# Patient Record
Sex: Male | Born: 1975 | Race: Black or African American | Hispanic: No | Smoking: Current every day smoker
Health system: Southern US, Community
[De-identification: ages and names within clinical notes are randomized; demographics above are authoritative.]

## PROBLEM LIST (undated history)

## (undated) DIAGNOSIS — K56609 Unspecified intestinal obstruction, unspecified as to partial versus complete obstruction: Secondary | ICD-10-CM

## (undated) HISTORY — PX: HERNIA REPAIR: SHX51

---
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
## 2005-07-14 HISTORY — PX: UMBILICAL HERNIA REPAIR: SHX196

## 2008-07-14 HISTORY — PX: NEUROPLASTY / TRANSPOSITION MEDIAN NERVE AT CARPAL TUNNEL: SUR893

## 2014-10-20 ENCOUNTER — Emergency Department (HOSPITAL_BASED_OUTPATIENT_CLINIC_OR_DEPARTMENT_OTHER): Payer: Federal, State, Local not specified - PPO

## 2014-10-20 ENCOUNTER — Encounter (HOSPITAL_BASED_OUTPATIENT_CLINIC_OR_DEPARTMENT_OTHER): Payer: Self-pay | Admitting: *Deleted

## 2014-10-20 ENCOUNTER — Emergency Department (HOSPITAL_BASED_OUTPATIENT_CLINIC_OR_DEPARTMENT_OTHER)
Admission: EM | Admit: 2014-10-20 | Discharge: 2014-10-20 | Disposition: A | Discharge status: Home / Self Care | Payer: Federal, State, Local not specified - PPO | Attending: Emergency Medicine | Admitting: Emergency Medicine

## 2014-10-20 DIAGNOSIS — R110 Nausea: Secondary | ICD-10-CM | POA: Insufficient documentation

## 2014-10-20 DIAGNOSIS — R1084 Generalized abdominal pain: Secondary | ICD-10-CM | POA: Diagnosis not present

## 2014-10-20 DIAGNOSIS — R1031 Right lower quadrant pain: Secondary | ICD-10-CM | POA: Insufficient documentation

## 2014-10-20 DIAGNOSIS — R140 Abdominal distension (gaseous): Secondary | ICD-10-CM | POA: Insufficient documentation

## 2014-10-20 LAB — COMPREHENSIVE METABOLIC PANEL
ALT: 39 U/L (ref 0–53)
AST: 30 U/L (ref 0–37)
Albumin: 4.7 g/dL (ref 3.5–5.2)
Alkaline Phosphatase: 85 U/L (ref 39–117)
Anion gap: 5 (ref 5–15)
BUN: 16 mg/dL (ref 6–23)
CALCIUM: 9.6 mg/dL (ref 8.4–10.5)
CO2: 29 mmol/L (ref 19–32)
CREATININE: 1.16 mg/dL (ref 0.50–1.35)
Chloride: 104 mmol/L (ref 96–112)
GFR calc Af Amer: >90 mL/min (ref >90)
GFR calc non Af Amer: 80 mL/min — ABNORMAL LOW (ref >90)
Glucose, Bld: 108 mg/dL — ABNORMAL HIGH (ref 70–99)
Potassium: 4 mmol/L (ref 3.5–5.1)
SODIUM: 138 mmol/L (ref 135–145)
TOTAL PROTEIN: 8.2 g/dL (ref 6.0–8.3)
Total Bilirubin: 0.5 mg/dL (ref 0.3–1.2)

## 2014-10-20 LAB — CBC
HCT: 45.7 % (ref 39.0–52.0)
Hemoglobin: 15.2 g/dL (ref 13.0–17.0)
MCH: 28.5 pg (ref 26.0–34.0)
MCHC: 33.3 g/dL (ref 30.0–36.0)
MCV: 85.7 fL (ref 78.0–100.0)
PLATELETS: 271 10*3/uL (ref 150–400)
RBC: 5.33 MIL/uL (ref 4.22–5.81)
RDW: 14.6 % (ref 11.5–15.5)
WBC: 14.6 10*3/uL — ABNORMAL HIGH (ref 4.0–10.5)

## 2014-10-20 LAB — URINALYSIS, ROUTINE W REFLEX MICROSCOPIC
BILIRUBIN URINE: NEGATIVE
GLUCOSE, UA: NEGATIVE mg/dL
Hgb urine dipstick: NEGATIVE
Ketones, ur: NEGATIVE mg/dL
Leukocytes, UA: NEGATIVE
NITRITE: NEGATIVE
PH: 7 (ref 5.0–8.0)
Protein, ur: NEGATIVE mg/dL
Specific Gravity, Urine: 1.019 (ref 1.005–1.030)
Urobilinogen, UA: 1 mg/dL (ref 0.0–1.0)

## 2014-10-20 LAB — LIPASE, BLOOD: Lipase: 22 U/L (ref 11–59)

## 2014-10-20 MED ORDER — IOHEXOL 300 MG/ML  SOLN
50.0000 mL | Freq: Once | INTRAMUSCULAR | Status: AC | PRN
  Administered 2014-10-20: 50 mL via ORAL

## 2014-10-20 MED ORDER — IOHEXOL 300 MG/ML  SOLN
100.0000 mL | Freq: Once | INTRAMUSCULAR | Status: AC | PRN
  Administered 2014-10-20: 100 mL via INTRAVENOUS

## 2014-10-20 MED ORDER — ONDANSETRON 4 MG PO TBDP: ORAL_TABLET | ORAL | Status: AC

## 2014-10-20 MED ORDER — HYDROMORPHONE HCL 1 MG/ML IJ SOLN
1.0000 mg | Freq: Once | INTRAMUSCULAR | Status: AC
  Administered 2014-10-20: 1 mg via INTRAVENOUS
  Filled 2014-10-20: qty 1

## 2014-10-20 MED ORDER — HYDROCODONE-ACETAMINOPHEN 5-325 MG PO TABS: 1.0000 | ORAL_TABLET | ORAL | Status: AC | PRN

## 2014-10-20 MED ORDER — ONDANSETRON HCL 4 MG/2ML IJ SOLN
4.0000 mg | Freq: Once | INTRAMUSCULAR | Status: AC
  Administered 2014-10-20: 4 mg via INTRAVENOUS
  Filled 2014-10-20: qty 2

## 2014-10-20 NOTE — ED Notes (Signed)
Patient transported to CT 

## 2014-10-20 NOTE — ED Provider Notes (Signed)
CSN: [id number]     Arrival date & time 10/20/14  1235 History   First MD Initiated Contact with Patient 10/20/14 1305     Chief Complaint  Patient presents with  . Abdominal Pain     (Consider location/radiation/quality/duration/timing/severity/associated sxs/prior Treatment) HPI Comments: 39 year old male complaining of sudden onset right lower quadrant abdominal pain beginning when he woke up earlier today. Pain has been constant, gradually increasing, worse with any movement, pressure, coughing, laughing. Pain is radiating throughout his entire abdomen. Admits to nausea without vomiting. Last bowel movement was yesterday and normal. No diarrhea. Denies fevers. Denies testicular pain or swelling. Denies any urinary symptoms. History of hernia repair. Yesterday evening for dinner he had chicken, and nothing out of his normal.  Patient is a 39 y.o. male presenting with abdominal pain. The history is provided by the patient.  Abdominal Pain Associated symptoms: nausea     History reviewed. No pertinent past medical history. Past Surgical History  Procedure Laterality Date  . Hernia repair     No family history on file. History  Substance Use Topics  . Smoking status: Never Smoker   . Smokeless tobacco: Not on file  . Alcohol Use: Yes    Review of Systems  Gastrointestinal: Positive for nausea and abdominal pain.  All other systems reviewed and are negative.     Allergies  Review of patient's allergies indicates no known allergies.  Home Medications   Prior to Admission medications   Medication Sig Start Date End Date Taking? Authorizing Provider  HYDROcodone-acetaminophen (NORCO/VICODIN) 5-325 MG per tablet Take 1-2 tablets by mouth every 4 (four) hours as needed. 10/20/14   Kathrynn Speed, PA-C  Multiple Vitamin (MULTIVITAMIN) tablet Take 1 tablet by mouth daily.   Yes Historical Provider, MD  ondansetron (ZOFRAN ODT) 4 MG disintegrating tablet  ODT q4 hours prn  nausea/vomit 10/20/14   Chaelyn Bunyan M Rylee Nuzum, PA-C   BP 145/76 mmHg  Pulse 91  Temp(Src) 98.5 F (36.9 C) (Oral)  Resp 18  Ht  (1.803 m)  Wt 255 lb (115.667 kg)  BMI 35.58 kg/m2  SpO2 97% Physical Exam  Constitutional: He is oriented to person, place, and time. He appears well-developed and well-nourished. No distress.  Uncomfortable but in NAD.  HENT:  Head: Normocephalic and atraumatic.  Eyes: Conjunctivae and EOM are normal.  Neck: Normal range of motion. Neck supple.  Cardiovascular: Normal rate, regular rhythm and normal heart sounds.   Pulmonary/Chest: Effort normal and breath sounds normal.  Abdominal: Bowel sounds are normal. He exhibits distension (mild). There is tenderness. There is rebound, guarding and tenderness at McBurney's point.  Generalized tenderness, worse right lower quadrant with guarding and rebound.  Musculoskeletal: Normal range of motion. He exhibits no edema.  Neurological: He is alert and oriented to person, place, and time.  Skin: Skin is warm and dry.  Psychiatric: He has a normal mood and affect. His behavior is normal.  Nursing note and vitals reviewed.   ED Course  Procedures (including critical care time) Labs Review Labs Reviewed  CBC - Abnormal; Notable for the following:    WBC 14.6 (*)    All other components within normal limits  COMPREHENSIVE METABOLIC PANEL - Abnormal; Notable for the following:    Glucose, Bld 108 (*)    GFR calc non Af Amer 80 (*)    All other components within normal limits  URINALYSIS, ROUTINE W REFLEX MICROSCOPIC  LIPASE, BLOOD    Imaging Review Ct Abdomen Pelvis  W Contrast  10/20/2014   CLINICAL DATA:  39 year old male with acute right abdominal and pelvic pain and nausea with chills.  EXAM: CT ABDOMEN AND PELVIS WITH CONTRAST  TECHNIQUE: Multidetector CT imaging of the abdomen and pelvis was performed using the standard protocol following bolus administration of intravenous contrast.  CONTRAST:  100mL OMNIPAQUE  IOHEXOL 300 MG/ML  SOLN  COMPARISON:  None.  FINDINGS: Lower chest:  Unremarkable  Hepatobiliary: The liver and gallbladder are unremarkable. There is no evidence of biliary dilatation.  Pancreas: Unremarkable  Spleen: Unremarkable  Adrenals/Urinary Tract: The kidneys, adrenal glands and bladder are unremarkable.  Stomach/Bowel: Unremarkable. There is no evidence of bowel obstruction or bowel wall thickening. The appendix is normal.  Vascular/Lymphatic: No enlarged lymph nodes or abdominal aortic aneurysm.  Reproductive: Prostate is unremarkable.  Other: No free fluid, abscess or pneumoperitoneum.  Musculoskeletal: No acute or suspicious abnormalities.  IMPRESSION: No acute or significant abnormalities.   Electronically Signed   By: Harmon PierJeffrey  Hu M.D.   On: 10/20/2014 15:04     EKG Interpretation None      MDM   Final diagnoses:  RLQ abdominal pain   Nontoxic appearing, uncomfortable but in no apparent distress. Vital signs stable. Afebrile. Abdomen is mildly distended, with significant tenderness in right lower quadrant. To obtain labs and obtain CT to evaluate for possible appendicitis.  3:15 PM CT negative. Labs showing leukocytosis of 14.6, otherwise no acute findings. UA negative. On re-examination, pt reports he is still in pain despite dilaudid and zofran. He is resting comfortably on bed moving around without difficulty. Abdomen is soft, still with generalized tenderness, however improved from initial exam. It is possible that he has a pulled muscle of his abdominal wall. Will d/c home with rx for vicodin and zofran. No vomiting in ED, tolerating PO. He has an appointment scheduled to establish care with primary care physician. Stable for discharge.   Kathrynn SpeedRobyn M Makell Drohan, PA-C 10/20/14 1517  Shon Batonourtney F Horton, MD 10/23/14 [phone]

## 2014-10-20 NOTE — Discharge Instructions (Signed)
Take Vicodin for severe pain only. No driving or operating heavy machinery while taking vicodin. This medication may cause drowsiness. Take Zofran as directed as needed for nausea. Follow up with your primary care physician as scheduled.  Abdominal Pain Many things can cause abdominal pain. Usually, abdominal pain is not caused by a disease and will improve without treatment. It can often be observed and treated at home. Your health care provider will do a physical exam and possibly order blood tests and X-rays to help determine the seriousness of your pain. However, in many cases, more time must pass before a clear cause of the pain can be found. Before that point, your health care provider may not know if you need more testing or further treatment. HOME CARE INSTRUCTIONS  Monitor your abdominal pain for any changes. The following actions may help to alleviate any discomfort you are experiencing:  Only take over-the-counter or prescription medicines as directed by your health care provider.  Do not take laxatives unless directed to do so by your health care provider.  Try a clear liquid diet (broth, tea, or water) as directed by your health care provider. Slowly move to a bland diet as tolerated. SEEK MEDICAL CARE IF:  You have unexplained abdominal pain.  You have abdominal pain associated with nausea or diarrhea.  You have pain when you urinate or have a bowel movement.  You experience abdominal pain that wakes you in the night.  You have abdominal pain that is worsened or improved by eating food.  You have abdominal pain that is worsened with eating fatty foods.  You have a fever. SEEK IMMEDIATE MEDICAL CARE IF:   Your pain does not go away within 2 hours.  You keep throwing up (vomiting).  Your pain is felt only in portions of the abdomen, such as the right side or the left lower portion of the abdomen.  You pass bloody or black tarry stools. MAKE SURE YOU:  Understand  these instructions.   Will watch your condition.   Will get help right away if you are not doing well or get worse.  Document Released: 04/09/2005 Document Revised: 07/05/2013 Document Reviewed: 03/09/2013 [facility] Patient Information 2015 [place], [place]. This information is not intended to replace advice given to you by your health care provider. Make sure you discuss any questions you have with your health care provider.

## 2014-10-20 NOTE — ED Notes (Signed)
Pt c/o RLQ pain that began this morning when he woke up. Pt c/o nausea but no vomiting or diarrhea.

## 2014-10-25 ENCOUNTER — Observation Stay (HOSPITAL_COMMUNITY)
Admission: EM | Admit: 2014-10-25 | Discharge: 2014-10-26 | Disposition: A | Discharge status: Home / Self Care | Payer: Federal, State, Local not specified - PPO | Attending: Internal Medicine | Admitting: Internal Medicine

## 2014-10-25 ENCOUNTER — Encounter (HOSPITAL_COMMUNITY): Payer: Self-pay | Admitting: Family Medicine

## 2014-10-25 ENCOUNTER — Observation Stay (HOSPITAL_COMMUNITY): Payer: Federal, State, Local not specified - PPO

## 2014-10-25 DIAGNOSIS — K5660 Unspecified intestinal obstruction: Secondary | ICD-10-CM | POA: Diagnosis not present

## 2014-10-25 DIAGNOSIS — R109 Unspecified abdominal pain: Secondary | ICD-10-CM | POA: Diagnosis present

## 2014-10-25 DIAGNOSIS — R1030 Lower abdominal pain, unspecified: Secondary | ICD-10-CM | POA: Diagnosis present

## 2014-10-25 DIAGNOSIS — L03311 Cellulitis of abdominal wall: Principal | ICD-10-CM | POA: Insufficient documentation

## 2014-10-25 DIAGNOSIS — I889 Nonspecific lymphadenitis, unspecified: Secondary | ICD-10-CM | POA: Diagnosis not present

## 2014-10-25 DIAGNOSIS — K5669 Other intestinal obstruction: Secondary | ICD-10-CM

## 2014-10-25 HISTORY — DX: Unspecified intestinal obstruction, unspecified as to partial versus complete obstruction: K56.609

## 2014-10-25 LAB — CBC WITH DIFFERENTIAL/PLATELET
Basophils Absolute: 0 10*3/uL (ref 0.0–0.1)
Basophils Relative: 0 % (ref 0–1)
EOS ABS: 0.1 10*3/uL (ref 0.0–0.7)
EOS PCT: 1 % (ref 0–5)
HEMATOCRIT: 46.6 % (ref 39.0–52.0)
HEMOGLOBIN: 15.7 g/dL (ref 13.0–17.0)
LYMPHS ABS: 3.6 10*3/uL (ref 0.7–4.0)
Lymphocytes Relative: 33 % (ref 12–46)
MCH: 29.1 pg (ref 26.0–34.0)
MCHC: 33.7 g/dL (ref 30.0–36.0)
MCV: 86.3 fL (ref 78.0–100.0)
MONOS PCT: 8 % (ref 3–12)
Monocytes Absolute: 0.9 10*3/uL (ref 0.1–1.0)
Neutro Abs: 6.5 10*3/uL (ref 1.7–7.7)
Neutrophils Relative %: 58 % (ref 43–77)
Platelets: 300 10*3/uL (ref 150–400)
RBC: 5.4 MIL/uL (ref 4.22–5.81)
RDW: 14.4 % (ref 11.5–15.5)
WBC: 11.1 10*3/uL — ABNORMAL HIGH (ref 4.0–10.5)

## 2014-10-25 LAB — CBC
HCT: 43.4 % (ref 39.0–52.0)
Hemoglobin: 14.5 g/dL (ref 13.0–17.0)
MCH: 28.7 pg (ref 26.0–34.0)
MCHC: 33.4 g/dL (ref 30.0–36.0)
MCV: 85.8 fL (ref 78.0–100.0)
Platelets: 287 10*3/uL (ref 150–400)
RBC: 5.06 MIL/uL (ref 4.22–5.81)
RDW: 14.4 % (ref 11.5–15.5)
WBC: 11.4 10*3/uL — AB (ref 4.0–10.5)

## 2014-10-25 LAB — COMPREHENSIVE METABOLIC PANEL
ALBUMIN: 3.7 g/dL (ref 3.5–5.2)
ALK PHOS: 82 U/L (ref 39–117)
ALT: 25 U/L (ref 0–53)
AST: 25 U/L (ref 0–37)
Anion gap: 9 (ref 5–15)
BUN: 16 mg/dL (ref 6–23)
CALCIUM: 9 mg/dL (ref 8.4–10.5)
CO2: 26 mmol/L (ref 19–32)
Chloride: 102 mmol/L (ref 96–112)
Creatinine, Ser: 1.34 mg/dL (ref 0.50–1.35)
GFR calc Af Amer: 78 mL/min — ABNORMAL LOW (ref >90)
GFR calc non Af Amer: 67 mL/min — ABNORMAL LOW (ref >90)
Glucose, Bld: 103 mg/dL — ABNORMAL HIGH (ref 70–99)
Potassium: 4.1 mmol/L (ref 3.5–5.1)
SODIUM: 137 mmol/L (ref 135–145)
TOTAL PROTEIN: 7.5 g/dL (ref 6.0–8.3)
Total Bilirubin: 0.9 mg/dL (ref 0.3–1.2)

## 2014-10-25 LAB — CREATININE, SERUM
Creatinine, Ser: 1.28 mg/dL (ref 0.50–1.35)
GFR calc Af Amer: 82 mL/min — ABNORMAL LOW (ref >90)
GFR calc non Af Amer: 71 mL/min — ABNORMAL LOW (ref >90)

## 2014-10-25 LAB — I-STAT CG4 LACTIC ACID, ED: Lactic Acid, Venous: 0.6 mmol/L (ref 0.5–2.0)

## 2014-10-25 MED ORDER — ACETAMINOPHEN 650 MG RE SUPP
650.0000 mg | Freq: Four times a day (QID) | RECTAL | Status: DC | PRN
Start: 2014-10-25 — End: 2014-10-26

## 2014-10-25 MED ORDER — SENNA 8.6 MG PO TABS
1.0000 | ORAL_TABLET | Freq: Two times a day (BID) | ORAL | Status: DC
  Filled 2014-10-25: qty 1

## 2014-10-25 MED ORDER — CLINDAMYCIN PHOSPHATE 600 MG/50ML IV SOLN
600.0000 mg | Freq: Three times a day (TID) | INTRAVENOUS | Status: DC
Start: 2014-10-26 — End: 2014-10-26
  Administered 2014-10-25 – 2014-10-26 (×2): 600 mg via INTRAVENOUS
  Filled 2014-10-25 (×4): qty 50

## 2014-10-25 MED ORDER — KCL IN DEXTROSE-NACL 20-5-0.45 MEQ/L-%-% IV SOLN
INTRAVENOUS | Status: DC
  Administered 2014-10-25: 22:00:00 via INTRAVENOUS
  Filled 2014-10-25 (×4): qty 1000

## 2014-10-25 MED ORDER — ONDANSETRON HCL 4 MG/2ML IJ SOLN: 4.0000 mg | Freq: Four times a day (QID) | INTRAMUSCULAR | Status: DC | PRN

## 2014-10-25 MED ORDER — HEPARIN SODIUM (PORCINE) 5000 UNIT/ML IJ SOLN
5000.0000 [IU] | Freq: Three times a day (TID) | INTRAMUSCULAR | Status: DC
Start: 2014-10-25 — End: 2014-10-26
  Administered 2014-10-26: 5000 [IU] via SUBCUTANEOUS
  Filled 2014-10-25 (×3): qty 1

## 2014-10-25 MED ORDER — ENOXAPARIN SODIUM 40 MG/0.4ML ~~LOC~~ SOLN
40.0000 mg | SUBCUTANEOUS | Status: DC
  Filled 2014-10-25: qty 0.4

## 2014-10-25 MED ORDER — OXYCODONE HCL 5 MG PO TABS: 5.0000 mg | ORAL_TABLET | ORAL | Status: DC | PRN

## 2014-10-25 MED ORDER — CLINDAMYCIN PHOSPHATE 600 MG/50ML IV SOLN
600.0000 mg | Freq: Once | INTRAVENOUS | Status: AC
  Administered 2014-10-25: 600 mg via INTRAVENOUS
  Filled 2014-10-25: qty 50

## 2014-10-25 MED ORDER — CLINDAMYCIN PHOSPHATE 600 MG/50ML IV SOLN
600.0000 mg | Freq: Three times a day (TID) | INTRAVENOUS | Status: DC
  Filled 2014-10-25 (×2): qty 50

## 2014-10-25 MED ORDER — SORBITOL 70 % SOLN: 30.0000 mL | Freq: Every day | Status: DC | PRN

## 2014-10-25 MED ORDER — DEXTROSE-NACL 5-0.9 % IV SOLN: INTRAVENOUS | Status: DC

## 2014-10-25 MED ORDER — SODIUM CHLORIDE 0.9 % IV BOLUS (SEPSIS)
1000.0000 mL | Freq: Once | INTRAVENOUS | Status: AC
  Administered 2014-10-25: 1000 mL via INTRAVENOUS

## 2014-10-25 MED ORDER — HYDROMORPHONE HCL 1 MG/ML IJ SOLN: 1.0000 mg | INTRAMUSCULAR | Status: DC | PRN

## 2014-10-25 MED ORDER — METRONIDAZOLE IN NACL 5-0.79 MG/ML-% IV SOLN
500.0000 mg | Freq: Three times a day (TID) | INTRAVENOUS | Status: DC
  Filled 2014-10-25: qty 100

## 2014-10-25 MED ORDER — MORPHINE SULFATE 2 MG/ML IJ SOLN: 1.0000 mg | INTRAMUSCULAR | Status: DC | PRN

## 2014-10-25 MED ORDER — ACETAMINOPHEN 325 MG PO TABS: 650.0000 mg | ORAL_TABLET | Freq: Four times a day (QID) | ORAL | Status: DC | PRN

## 2014-10-25 MED ORDER — ONDANSETRON HCL 4 MG PO TABS: 4.0000 mg | ORAL_TABLET | Freq: Four times a day (QID) | ORAL | Status: DC | PRN

## 2014-10-25 MED ORDER — CIPROFLOXACIN IN D5W 400 MG/200ML IV SOLN
400.0000 mg | Freq: Two times a day (BID) | INTRAVENOUS | Status: DC
  Filled 2014-10-25: qty 200

## 2014-10-25 NOTE — Consult Note (Signed)
[facility] Surgery Consult Note  Marcus Ferguson 08/12/1975  [id number].    Requesting MD: Dr. Regenia Skeeter Chief Complaint/Reason for Consult: Abdominal pain  HPI:  39 y/o AA male sent from [facility] urgent care at [facility] for admission and IV antibiotics for abscess versus cellulitis to his right lower quadrant. Since 10/20/14 the patient's been having right lower quadrant pain. It started off acutely and he went to [facility] [place] where he had a negative CT scan. He is discharged on oral pain and nausea medicines. On 4/11 he went to this urgent care where they did blood work and repeated a CT scan. That CT scan showed enlargement of pelvic lymph nodes especially on the right. This was suggestive of adenitis and/or cellulitis. There is also increased density in the anterior pelvic wall near the right inguinal canal.  He was started on Cipro and Flagyl which he says has not helped.  He was not getting better so he returned to this urgent care today where he was given IM Toradol. [place] talked to Dr. Tyrell Ferguson, on call hospitalist and wanted them to send him to the ER for further evaluation.  She states that his pain is continued his been constant and more severe since onset.  Pain in RLQ and radiates to groin.  No testicular/scrotal swelling or pain.  He has had low-grade temperatures.  Has had nausea and vomiting this morning. Pain medicine helps some.  Feels warmth to his right lower quadrant but has not noticed any skin color change. Feels like his abdomen is distended.  ROS: All systems reviewed and otherwise negative except for as above  History reviewed. No pertinent family history.  History reviewed. No pertinent past medical history.  Past Surgical History  Procedure Laterality Date  . Hernia repair      Social History:  reports that he has never smoked. He does not have any smokeless tobacco history on file. He reports that he drinks alcohol. He reports that he does not use  illicit drugs.  Allergies: No Known Allergies   (Not in a hospital admission)  Blood pressure 132/67, pulse 77, temperature 98.2 F (36.8 C), temperature source Oral, resp. rate 18, SpO2 98 %. Physical Exam: General: pleasant, WD/WN AA male who is laying in bed in NAD HEENT: head is normocephalic, atraumatic.  Sclera are noninjected.  PERRL.  Ears and nose without any masses or lesions.  Mouth is pink and moist Heart: regular, rate, and rhythm.  No obvious murmurs, gallops, or rubs noted.  Palpable pedal pulses bilaterally Lymph:  No LAD noted in the neck, axilla, or groin. Lungs: CTAB, no wheezes, rhonchi, or rales noted.  Respiratory effort nonlabored Abd: soft, distended, tender in the right abdomen to groin mildly tender adjacent to this area, +BS, no masses, hernias, or organomegaly, scar in RLQ from hernia repair and a scar periumbilical from hernia repair MS: all 4 extremities are symmetrical with no cyanosis, clubbing, or edema. Skin: warm and dry with no masses, lesions, or rashes, no erythema or induration Psych: A&Ox3 with an appropriate affect.   Results for orders placed or performed during the hospital encounter of 10/25/14 (from the past 48 hour(s))  Comprehensive metabolic panel     Status: Abnormal   Collection Time: 10/25/14  3:35 PM  Result Value Ref Range   Sodium 137 135 - 145 mmol/L   Potassium 4.1 3.5 - 5.1 mmol/L   Chloride 102 96 - 112 mmol/L   CO2 26 19 -  32 mmol/L   Glucose, Bld 103 (H) 70 - 99 mg/dL   BUN 16 6 - 23 mg/dL   Creatinine, Ser 1.34 0.50 - 1.35 mg/dL   Calcium 9.0 8.4 - 10.5 mg/dL   Total Protein 7.5 6.0 - 8.3 g/dL   Albumin 3.7 3.5 - 5.2 g/dL   AST 25 0 - 37 U/L   ALT 25 0 - 53 U/L   Alkaline Phosphatase 82 39 - 117 U/L   Total Bilirubin 0.9 0.3 - 1.2 mg/dL   GFR calc non Af Amer 67 (L) >90 mL/min   GFR calc Af Amer 78 (L) >90 mL/min    Comment: (NOTE) The eGFR has been calculated using the CKD EPI equation. This calculation has not  been validated in all clinical situations. eGFR's persistently <90 mL/min signify possible Chronic Kidney Disease.    Anion gap 9 5 - 15  CBC with Differential     Status: Abnormal   Collection Time: 10/25/14  3:35 PM  Result Value Ref Range   WBC 11.1 (H) 4.0 - 10.5 K/uL   RBC 5.40 4.22 - 5.81 MIL/uL   Hemoglobin 15.7 13.0 - 17.0 g/dL   HCT 46.6 39.0 - 52.0 %   MCV 86.3 78.0 - 100.0 fL   MCH 29.1 26.0 - 34.0 pg   MCHC 33.7 30.0 - 36.0 g/dL   RDW 14.4 11.5 - 15.5 %   Platelets 300 150 - 400 K/uL   Neutrophils Relative % 58 43 - 77 %   Neutro Abs 6.5 1.7 - 7.7 K/uL   Lymphocytes Relative 33 12 - 46 %   Lymphs Abs 3.6 0.7 - 4.0 K/uL   Monocytes Relative 8 3 - 12 %   Monocytes Absolute 0.9 0.1 - 1.0 K/uL   Eosinophils Relative 1 0 - 5 %   Eosinophils Absolute 0.1 0.0 - 0.7 K/uL   Basophils Relative 0 0 - 1 %   Basophils Absolute 0.0 0.0 - 0.1 K/uL  I-Stat CG4 Lactic Acid, ED     Status: None   Collection Time: 10/25/14  3:45 PM  Result Value Ref Range   Lactic Acid, Venous 0.60 0.5 - 2.0 mmol/L   No results found.    Assessment/Plan Abdominal wall cellulitis Reactive pelvic lymphadenopathy -No acute process in the abdomen/pelvis, 2 essentially negative CT scans except for possible abdominal wall/right groin wall cellulitis -Nothing surgical to operate on at this time.  Cellullitis of the abdominal wall does not seem to have a source.  Lymph nodes are likely reactive.   -May need medical admission, IV antibiotics and monitoring overnight -Dr. Brantley Stage or the on call surgeon to see and evaluate and giver any further recommendations  H/o inguinal and umbilical hernia repair   Coralie Keens, [facility] 10/25/2014, 4:33 PM Pager: [phone]

## 2014-10-25 NOTE — ED Provider Notes (Addendum)
CSN: [id number]     Arrival date & time 10/25/14  1054 History   First MD Initiated Contact with Patient 10/25/14 1512     Chief Complaint  Patient presents with  . Abdominal Pain     (Consider location/radiation/quality/duration/timing/severity/associated sxs/prior Treatment) HPI  39 [facility] for admission and IV antibiotics for abscess versus cellulitis to his right lower quadrant. Since 4/8 the patient's been having right lower quadrant pain. It started off acutely and he went to [facility] [place] where he had a negative CT scan. He is discharged on oral pain and nausea medicines. On 4/11 he went to this urgent care where they did blood work and repeated a CT scan. That CT scan showed enlargement of pelvic lymph nodes especially on the right. This was suggestive of adenitis and/or cellulitis. There is also increased density in the anterior pelvic wall near the right inguinal canal. He was started on Cipro and Flagyl. He was not getting better so he returned to this urgent care today where he was given IM Toradol. The PA. [facility] who asked the patient to be sent to the ER for probable admission. She states that his pain is continued his been constant since onset. He has had low-grade temperatures, highest being 99 last night. Has had nausea and vomiting this morning. Pain medicine is moderately helping. Feels warmth to his right lower quadrant but has not noticed any skin color change. Feels like his abdomen is distending.  History reviewed. No pertinent past medical history. Past Surgical History  Procedure Laterality Date  . Hernia repair     History reviewed. No pertinent family history. History  Substance Use Topics  . Smoking status: Never Smoker   . Smokeless tobacco: Not on file  . Alcohol Use: Yes    Review of Systems  Constitutional: Positive for fever (low grade).    Gastrointestinal: Positive for nausea, vomiting and abdominal pain. Negative for diarrhea and constipation.  Genitourinary: Positive for hematuria.  All other systems reviewed and are negative.     Allergies  Review of patient's allergies indicates no known allergies.  Home Medications   Prior to Admission medications   Medication Sig Start Date End Date Taking? Authorizing Provider  ondansetron (ZOFRAN-ODT) 4 MG disintegrating tablet Take 4 mg by mouth. 10/20/14  Yes Historical Provider, MD  ciprofloxacin (CIPRO) 750 MG tablet Take 750 mg by mouth 2 (two) times daily. 10/23/14   Historical Provider, MD  HYDROcodone-acetaminophen (NORCO/VICODIN) 5-325 MG per tablet Take 1-2 tablets by mouth every 4 (four) hours as needed. 10/20/14   Robyn M Hess, PA-C  metroNIDAZOLE (FLAGYL) 500 MG tablet Take 500 mg by mouth 2 (two) times daily. 10/23/14   Historical Provider, MD  Multiple Vitamin (MULTI-VITAMINS) TABS Take 1 tablet by mouth daily.    Historical Provider, MD  Multiple Vitamin (MULTIVITAMIN) tablet Take 1 tablet by mouth daily.    Historical Provider, MD  ondansetron (ZOFRAN ODT) 4 MG disintegrating tablet 4mg  ODT q4 hours prn nausea/vomit 10/20/14   Robyn M Hess, PA-C  oxyCODONE-acetaminophen (PERCOCET) 7.5-325 MG per tablet Take by mouth every 4 (four) hours as needed. For up to 5 days. 10/23/14   Historical Provider, MD   BP 128/81 mmHg  Pulse 88  Temp(Src) 98.2 F (36.8 C) (Oral)  Resp 18  SpO2 96% Physical Exam  Constitutional: He is oriented to person, place, and time. He appears  well-developed and well-nourished. No distress.  HENT:  Head: Normocephalic and atraumatic.  Right Ear: External ear normal.  Left Ear: External ear normal.  Nose: Nose normal.  Eyes: Right eye exhibits no discharge. Left eye exhibits no discharge.  Neck: Neck supple.  Cardiovascular: Normal rate, regular rhythm, normal heart sounds and intact distal pulses.   Pulmonary/Chest: Effort normal.   Abdominal: Soft. There is tenderness. There is rebound. Hernia confirmed negative in the right inguinal area and confirmed negative in the left inguinal area.    Diffuse abdominal tenderness. RUQ, LUQ and LLQ cause pain in RLQ. No rigidity but + rebound  Musculoskeletal: He exhibits no edema.  Neurological: He is alert and oriented to person, place, and time.  Skin: Skin is warm and dry. He is not diaphoretic.  Nursing note and vitals reviewed.   ED Course  Procedures (including critical care time) Labs Review Labs Reviewed  COMPREHENSIVE METABOLIC PANEL - Abnormal; Notable for the following:    Glucose, Bld 103 (*)    GFR calc non Af Amer 67 (*)    GFR calc Af Amer 78 (*)    All other components within normal limits  CBC WITH DIFFERENTIAL/PLATELET - Abnormal; Notable for the following:    WBC 11.1 (*)    All other components within normal limits  CBC - Abnormal; Notable for the following:    WBC 11.4 (*)    All other components within normal limits  CREATININE, SERUM - Abnormal; Notable for the following:    GFR calc non Af Amer 71 (*)    GFR calc Af Amer 82 (*)    All other components within normal limits  BASIC METABOLIC PANEL  CBC  HIV ANTIBODY (ROUTINE TESTING)  I-STAT CG4 LACTIC ACID, ED  GC/CHLAMYDIA PROBE AMP ([place])    Imaging Review No results found.   EKG Interpretation None      MDM   Final diagnoses:  Abdominal pain    Patient with worsening right lower quadrant abdominal pain. CT scan and lab work from prior urgent care visit reviewed. Questionable cellulitis with right adenitis in the pelvis. Given his worsening pain, he will be treated for possible abdominal wall cellulitis with IV clindamycin. If this is cellulitis the prior Cipro and Flagyl likely would not of work. The patient does have worsening abdominal pain but given his 2 recent CT scans in the last 1 week I have had surgery, consult. At this point they agree that he does not need  emergent CT but should be followed with serial abdominal exams as well as treatment for possible infection. Admit to the hospitalist.    Pricilla Loveless, MD 10/26/14 0454  Pricilla Loveless, MD 10/26/14 [phone]

## 2014-10-25 NOTE — H&P (Signed)
[facility] History and Physical  Marcus Ferguson ZOX:[id number] DOB: [birth year] DOA: 10/25/2014  Referring physician: Dr. Criss AlvineGoldston PCP: No PCP Per Patient   Chief Complaint: Right lower quadrant and groin discomfort  HPI: Marcus SizerLavon Stolz is a 39 y.o. male  Previously healthy who has had 2 prior visits to the ED for right lower quadrant and groin discomfort. Patient has had 2 CT scans of abdomen which have essentially been unrevealing based on reports. Patient presents again today to the ED with same complaint. Prior CT scan did make mention of adenitis and swollen pelvic lymph nodes. General surgery was consulted and found no surgery. Recommended medical admission for further evaluation recommendations. The patient other than discomfort has no other complaints.  We were consulted for further medical evaluation recommendations   Review of Systems:  Constitutional:  No weight loss, night sweats, Fevers, chills, fatigue.  HEENT:  No headaches, Difficulty swallowing,Tooth/dental problems,Sore throat,  No sneezing, itching, ear ache, nasal congestion, post nasal drip,  Cardio-vascular:  No chest pain, Orthopnea, PND, swelling in lower extremities, anasarca, dizziness, palpitations  GI:  No heartburn, indigestion, + abdominal pain, nausea, vomiting, diarrhea, change in bowel habits, loss of appetite  Resp:  No shortness of breath with exertion or at rest. No excess mucus, no productive cough, No non-productive cough, No coughing up of blood.No change in color of mucus.No wheezing.No chest wall deformity  Skin:  no rash or lesions.  GU:  no dysuria, change in color of urine, no urgency or frequency. No flank pain.  Musculoskeletal:  No joint pain or swelling. No decreased range of motion. No back pain.  Psych:  No change in mood or affect. No depression or anxiety. No memory loss.   History reviewed. No pertinent past medical history. Past Surgical History  Procedure Laterality  Date  . Hernia repair      Inguinal and Umbilical   Social History:  reports that he has never smoked. He does not have any smokeless tobacco history on file. He reports that he drinks alcohol. He reports that he does not use illicit drugs.  No Known Allergies  History reviewed.   Family history reviewed - Patient does not report any family history of any medical disorders when asked directly  Prior to Admission medications   Medication Sig Start Date End Date Taking? Authorizing Provider  ciprofloxacin (CIPRO) 750 MG tablet Take 750 mg by mouth 2 (two) times daily. 10/23/14  Yes Historical Provider, MD  metroNIDAZOLE (FLAGYL) 500 MG tablet Take 500 mg by mouth 3 (three) times daily.  10/23/14  Yes Historical Provider, MD  oxyCODONE-acetaminophen (PERCOCET) 7.5-325 MG per tablet Take by mouth every 4 (four) hours as needed. For up to 5 days. 10/23/14  Yes Historical Provider, MD  HYDROcodone-acetaminophen (NORCO/VICODIN) 5-325 MG per tablet Take 1-2 tablets by mouth every 4 (four) hours as needed. Patient not taking: Reported on 10/25/2014 10/20/14   Kathrynn Speedobyn M Hess, PA-C  ondansetron (ZOFRAN ODT) 4 MG disintegrating tablet 4mg  ODT q4 hours prn nausea/vomit Patient not taking: Reported on 10/25/2014 10/20/14   Kathrynn Speedobyn M Hess, PA-C   Physical Exam: Filed Vitals:   10/25/14 1118 10/25/14 1449 10/25/14 1538 10/25/14 1724  BP:  128/81 132/67 121/79  Pulse: 110 88 77 77  Temp: 98.6 F (37 C) 98.2 F (36.8 C)    TempSrc: Oral Oral    Resp: 18 18  18   SpO2: 97% 96% 98% 97%    Wt Readings from Last 3 Encounters:  10/20/14 115.667  kg (255 lb)    General:  Appears calm and comfortable Eyes: PERRL, normal lids, irises & conjunctiva ENT: grossly normal hearing, lips & tongue Neck: no LAD, masses or thyromegaly Cardiovascular: RRR, no m/r/g. No LE edema. Respiratory: CTA bilaterally, no w/r/r. Normal respiratory effort. Abdomen: soft, tenderness at right lower quadrant with rebound tenderness,  nondistended Skin: no rash or induration seen on limited exam Musculoskeletal: grossly normal tone BUE/BLE Psychiatric: grossly normal mood and affect, speech fluent and appropriate Neurologic: grossly non-focal.          Labs on Admission:  Basic Metabolic Panel:  Recent Labs Lab 10/20/14 1320 10/25/14 1535  NA 138 137  K 4.0 4.1  CL 104 102  CO2 29 26  GLUCOSE 108* 103*  BUN 16 16  CREATININE 1.16 1.34  CALCIUM 9.6 9.0   Liver Function Tests:  Recent Labs Lab 10/20/14 1320 10/25/14 1535  AST 30 25  ALT 39 25  ALKPHOS 85 82  BILITOT 0.5 0.9  PROT 8.2 7.5  ALBUMIN 4.7 3.7    Recent Labs Lab 10/20/14 1320  LIPASE 22   No results for input(s): AMMONIA in the last 168 hours. CBC:  Recent Labs Lab 10/20/14 1320 10/25/14 1535  WBC 14.6* 11.1*  NEUTROABS  --  6.5  HGB 15.2 15.7  HCT 45.7 46.6  MCV 85.7 86.3  PLT 271 300   Cardiac Enzymes: No results for input(s): CKTOTAL, CKMB, CKMBINDEX, TROPONINI in the last 168 hours.  BNP (last 3 results) No results for input(s): BNP in the last 8760 hours.  ProBNP (last 3 results) No results for input(s): PROBNP in the last 8760 hours.  CBG: No results for input(s): GLUCAP in the last 168 hours.  Radiological Exams on Admission: Dg Abd 1 View  10/25/2014   CLINICAL DATA:  RIGHT lower quadrant pain for 5 days, nausea  EXAM: ABDOMEN - 1 VIEW  COMPARISON:  CT abdomen and pelvis 10/23/2014  FINDINGS: Retained contrast in appendix, which appears normal in caliber.  Retained contrast throughout colon.  Dilated small bowel loops in the LEFT and mid abdomen compatible with small bowel obstruction, increased since previous exam.  Loops measure up to 4.8 cm diameter.  No definite bowel wall thickening.  Bones unremarkable.  No definite urinary tract calcification.  IMPRESSION: Increased small bowel dilatation in the LEFT and mid abdomen compatible with proximal to mid small bowel obstruction, affected small bowel loop  diameters increased since previous exam.  Retained contrast within appendix and colon.   Electronically Signed   By: Ulyses Southward M.D.   On: 10/25/2014 18:02     Assessment/Plan Active Problems:   Lower abdominal pain  - General surgery consulted, did not find surgery. Recommended medical admission and IV antibiotics with observation - Patient has had 2 CT scans of abdomen - Most likely due to SBO on recently resulted abd x ray - Patient also has lymphadenopathy which are most likely reactive to possible abdominal wall/right groin wall cellulitis. Place on Clindamycin  SBO - reported on x ray - Place on MIVF's - supportive therapy   Code Status: full DVT Prophylaxis: heparin Family Communication: None at bedside Disposition Plan: Med surg observation  Time spent: > 45 minutes  Penny Pia [facility] Pager [phone]

## 2014-10-25 NOTE — ED Notes (Signed)
Patient transported to X-ray 

## 2014-10-25 NOTE — ED Notes (Signed)
Pt sent here for IV abx. sts possible abscess to RLQ. sts has been on oral abx and not helping.

## 2014-10-25 NOTE — Progress Notes (Signed)
Initial assessment complete, lying in bed, SR up, call bell in reach, IVF infusing to LAC without problems, site clear. Patient alert, oriented, denies pain or problems at this time. Reviewed orders, oriented to room, call bell, bed controls with verbal understanding. Patient refusing to watch patient safety video at this time. No problems noted at this time, will monitor. Awaiting admission nurse for admission paperwork

## 2014-10-25 NOTE — Progress Notes (Signed)
Admission nurse notified of need for admission

## 2014-10-26 DIAGNOSIS — R1084 Generalized abdominal pain: Secondary | ICD-10-CM | POA: Diagnosis not present

## 2014-10-26 LAB — BASIC METABOLIC PANEL
ANION GAP: 8 (ref 5–15)
BUN: 13 mg/dL (ref 6–23)
CALCIUM: 8.9 mg/dL (ref 8.4–10.5)
CO2: 28 mmol/L (ref 19–32)
CREATININE: 1.36 mg/dL — AB (ref 0.50–1.35)
Chloride: 106 mmol/L (ref 96–112)
GFR, EST AFRICAN AMERICAN: 76 mL/min — AB (ref >90)
GFR, EST NON AFRICAN AMERICAN: 66 mL/min — AB (ref >90)
Glucose, Bld: 106 mg/dL — ABNORMAL HIGH (ref 70–99)
Potassium: 4.7 mmol/L (ref 3.5–5.1)
Sodium: 142 mmol/L (ref 135–145)

## 2014-10-26 LAB — CBC
HEMATOCRIT: 41.7 % (ref 39.0–52.0)
Hemoglobin: 13.2 g/dL (ref 13.0–17.0)
MCH: 27.7 pg (ref 26.0–34.0)
MCHC: 31.7 g/dL (ref 30.0–36.0)
MCV: 87.6 fL (ref 78.0–100.0)
Platelets: 278 10*3/uL (ref 150–400)
RBC: 4.76 MIL/uL (ref 4.22–5.81)
RDW: 14.5 % (ref 11.5–15.5)
WBC: 8.3 10*3/uL (ref 4.0–10.5)

## 2014-10-26 LAB — HIV ANTIBODY (ROUTINE TESTING W REFLEX): HIV Screen 4th Generation wRfx: NONREACTIVE

## 2014-10-26 NOTE — Progress Notes (Signed)
Patient ID: Marcus Ferguson, male   DOB: 1976/06/26, 39 y.o.   MRN: [id number]     [facility]      [address] [place], [place] [address]    Phone: [phone] FAX: [phone]     Subjective: Resolved pain. Had a BM.  Leukocytosis resolved.   Objective:  Vital signs:  Filed Vitals:   10/25/14 1724 10/25/14 1846 10/25/14 2207 10/26/14 0510  BP: 121/79 158/95 141/75 116/65  Pulse: 77  91 80  Temp:  99.6 F (37.6 C) 99.8 F (37.7 C) 98.2 F (36.8 C)  TempSrc:  Oral Oral Oral  Resp: _0 SpO2: 97% 98% 95% 96%    Last BM Date: 10/25/14  Intake/Output   Yesterday:  04/13 0701 - 04/14 0700 In: 793.3 [I.V.:793.3] Out: 400 [Urine:400] This shift:      Physical Exam: General: Pt awake/alert/oriented x4 in no acute distress  Abdomen: Soft.  Nondistended.  Non tender.  No evidence of peritonitis.  No incarcerated hernias.    Problem List:   Active Problems:   Lower abdominal pain   Abdominal pain   Adenitis    Results:   Labs: Results for orders placed or performed during the hospital encounter of 10/25/14 (from the past 48 hour(s))  Comprehensive metabolic panel     Status: Abnormal   Collection Time: 10/25/14  3:35 PM  Result Value Ref Range   Sodium 137 135 - 145 mmol/L   Potassium 4.1 3.5 - 5.1 mmol/L   Chloride 102 96 - 112 mmol/L   CO2 26 19 - 32 mmol/L   Glucose, Bld 103 (H) 70 - 99 mg/dL   BUN 16 6 - 23 mg/dL   Creatinine, Ser 1.34 0.50 - 1.35 mg/dL   Calcium 9.0 8.4 - 10.5 mg/dL   Total Protein 7.5 6.0 - 8.3 g/dL   Albumin 3.7 3.5 - 5.2 g/dL   AST 25 0 - 37 U/L   ALT 25 0 - 53 U/L   Alkaline Phosphatase 82 39 - 117 U/L   Total Bilirubin 0.9 0.3 - 1.2 mg/dL   GFR calc non Af Amer 67 (L) >90 mL/min   GFR calc Af Amer 78 (L) >90 mL/min    Comment: (NOTE) The eGFR has been calculated using the CKD EPI equation. This calculation has not been validated in all clinical situations. eGFR's  persistently <90 mL/min signify possible Chronic Kidney Disease.    Anion gap 9 5 - 15  CBC with Differential     Status: Abnormal   Collection Time: 10/25/14  3:35 PM  Result Value Ref Range   WBC 11.1 (H) 4.0 - 10.5 K/uL   RBC 5.40 4.22 - 5.81 MIL/uL   Hemoglobin 15.7 13.0 - 17.0 g/dL   HCT 46.6 39.0 - 52.0 %   MCV 86.3 78.0 - 100.0 fL   MCH 29.1 26.0 - 34.0 pg   MCHC 33.7 30.0 - 36.0 g/dL   RDW 14.4 11.5 - 15.5 %   Platelets 300 150 - 400 K/uL   Neutrophils Relative % 58 43 - 77 %   Neutro Abs 6.5 1.7 - 7.7 K/uL   Lymphocytes Relative 33 12 - 46 %   Lymphs Abs 3.6 0.7 - 4.0 K/uL   Monocytes Relative 8 3 - 12 %   Monocytes Absolute 0.9 0.1 - 1.0 K/uL   Eosinophils Relative 1 0 - 5 %   Eosinophils Absolute 0.1 0.0 - 0.7  K/uL   Basophils Relative 0 0 - 1 %   Basophils Absolute 0.0 0.0 - 0.1 K/uL  I-Stat CG4 Lactic Acid, ED     Status: None   Collection Time: 10/25/14  3:45 PM  Result Value Ref Range   Lactic Acid, Venous 0.60 0.5 - 2.0 mmol/L  CBC     Status: Abnormal   Collection Time: 10/25/14  9:25 PM  Result Value Ref Range   WBC 11.4 (H) 4.0 - 10.5 K/uL   RBC 5.06 4.22 - 5.81 MIL/uL   Hemoglobin 14.5 13.0 - 17.0 g/dL   HCT 43.4 39.0 - 52.0 %   MCV 85.8 78.0 - 100.0 fL   MCH 28.7 26.0 - 34.0 pg   MCHC 33.4 30.0 - 36.0 g/dL   RDW 14.4 11.5 - 15.5 %   Platelets 287 150 - 400 K/uL  Creatinine, serum     Status: Abnormal   Collection Time: 10/25/14  9:25 PM  Result Value Ref Range   Creatinine, Ser 1.28 0.50 - 1.35 mg/dL   GFR calc non Af Amer 71 (L) >90 mL/min   GFR calc Af Amer 82 (L) >90 mL/min    Comment: (NOTE) The eGFR has been calculated using the CKD EPI equation. This calculation has not been validated in all clinical situations. eGFR's persistently <90 mL/min signify possible Chronic Kidney Disease.   Basic metabolic panel     Status: Abnormal   Collection Time: 10/26/14  6:33 AM  Result Value Ref Range   Sodium 142 135 - 145 mmol/L   Potassium 4.7  3.5 - 5.1 mmol/L   Chloride 106 96 - 112 mmol/L   CO2 28 19 - 32 mmol/L   Glucose, Bld 106 (H) 70 - 99 mg/dL   BUN 13 6 - 23 mg/dL   Creatinine, Ser 1.36 (H) 0.50 - 1.35 mg/dL   Calcium 8.9 8.4 - 10.5 mg/dL   GFR calc non Af Amer 66 (L) >90 mL/min   GFR calc Af Amer 76 (L) >90 mL/min    Comment: (NOTE) The eGFR has been calculated using the CKD EPI equation. This calculation has not been validated in all clinical situations. eGFR's persistently <90 mL/min signify possible Chronic Kidney Disease.    Anion gap 8 5 - 15  CBC     Status: None   Collection Time: 10/26/14  6:33 AM  Result Value Ref Range   WBC 8.3 4.0 - 10.5 K/uL   RBC 4.76 4.22 - 5.81 MIL/uL   Hemoglobin 13.2 13.0 - 17.0 g/dL   HCT 41.7 39.0 - 52.0 %   MCV 87.6 78.0 - 100.0 fL   MCH 27.7 26.0 - 34.0 pg   MCHC 31.7 30.0 - 36.0 g/dL   RDW 14.5 11.5 - 15.5 %   Platelets 278 150 - 400 K/uL    Imaging / Studies: Dg Abd 1 View  10/25/2014   CLINICAL DATA:  RIGHT lower quadrant pain for 5 days, nausea  EXAM: ABDOMEN - 1 VIEW  COMPARISON:  CT abdomen and pelvis 10/23/2014  FINDINGS: Retained contrast in appendix, which appears normal in caliber.  Retained contrast throughout colon.  Dilated small bowel loops in the LEFT and mid abdomen compatible with small bowel obstruction, increased since previous exam.  Loops measure up to 4.8 cm diameter.  No definite bowel wall thickening.  Bones unremarkable.  No definite urinary tract calcification.  IMPRESSION: Increased small bowel dilatation in the LEFT and mid abdomen compatible with proximal to mid small  bowel obstruction, affected small bowel loop diameters increased since previous exam.  Retained contrast within appendix and colon.   Electronically Signed   By: Lavonia Dana M.D.   On: 10/25/2014 18:02    Medications / Allergies:  Scheduled Meds: . clindamycin (CLEOCIN) IV  600 mg Intravenous 3 times per day  . heparin  5,000 Units Subcutaneous 3 times per day   Continuous  Infusions: . dextrose 5 % and 0.45 % NaCl with KCl 20 mEq/L 100 mL/hr at 10/25/14 2204   PRN Meds:.acetaminophen **OR** acetaminophen, morphine injection, ondansetron **OR** ondansetron (ZOFRAN) IV, oxyCODONE  Antibiotics: Anti-infectives    Start     Dose/Rate Route Frequency Ordered Stop   10/26/14 0000  clindamycin (CLEOCIN) IVPB 600 mg     600 mg 100 mL/hr over 30 Minutes Intravenous 3 times per day 10/25/14 2032     10/25/14 2200  clindamycin (CLEOCIN) IVPB 600 mg  Status:  Discontinued     600 mg 100 mL/hr over 30 Minutes Intravenous 3 times per day 10/25/14 2012 10/25/14 2032   10/25/14 1730  ciprofloxacin (CIPRO) IVPB 400 mg  Status:  Discontinued     400 mg 200 mL/hr over 60 Minutes Intravenous Every 12 hours 10/25/14 1717 10/25/14 2013   10/25/14 1730  metroNIDAZOLE (FLAGYL) IVPB 500 mg  Status:  Discontinued     500 mg 100 mL/hr over 60 Minutes Intravenous Every 8 hours 10/25/14 1717 10/25/14 2043   10/25/14 1715  clindamycin (CLEOCIN) IVPB 600 mg     600 mg 100 mL/hr over 30 Minutes Intravenous  Once 10/25/14 1702 10/25/14 1808        Assessment/Plan Abdominal pain Abdominal wall cellulitis Reactive pelvic lymphadenopathy -resolved.  Not obstructed.  Advance diet.  Would change to PO antibiotics for total of 5-7 days.  May discharge from surgical standpoint if able to tolerate clears  Erby Pian, [facility] Pager [phone]) For consults and floor pages call [phone](7A-4:30P)  10/26/2014 10:36 AM

## 2014-10-26 NOTE — Consult Note (Signed)
Marcus Wilson EdD 

## 2014-10-26 NOTE — Discharge Summary (Signed)
Physician Discharge Summary  Marcus Ferguson UUV:[id number] DOB: 01-10-1976 DOA: 10/25/2014  PCP: No PCP Per Patient  Admit date: 10/25/2014 Discharge date: 10/26/2014  Time spent: greater than 30 minutes  Recommendations for Outpatient Follow-up:  1. Repeat CT 3-6 months  Discharge Diagnoses:  Active Problems:   Lower abdominal pain   Abdominal pain   Adenitis   Discharge Condition: stable  Diet recommendation: general  There were no vitals filed for this visit.  History of present illness:  39 y.o. male  Previously healthy who has had 2 prior visits to the ED for right lower quadrant and groin discomfort. Patient has had 2 CT scans of abdomen which have essentially been unrevealing based on reports. Patient presents again today to the ED with same complaint. Prior CT scan did make mention of adenitis and swollen pelvic lymph nodes. General surgery was consulted and found no surgery. Recommended medical admission for further evaluation recommendations. The patient other than discomfort has no other complaints.   Hospital Course:  Admitted to hospitalists. General surgery consulted.  Suspect lymphadenopathy secondary to infection.  Initially, concerns of PSBO. Started on bowel rest.  Pain improved. Diet advanced. Surgery recommended advancing abx and continuing abx as outpt. Doubt. SBO  Procedures: None  Consultations:  General surgery  Discharge Exam: Filed Vitals:   10/26/14 0510  BP: 116/65  Pulse: 80  Temp: 98.2 F (36.8 C)  Resp: 23    General: a and o Cardiovascular: RRR Respiratory: CTA abd s, nt, nd. Normal BS No CCE  Discharge Instructions   Discharge Instructions    Activity as tolerated - No restrictions    Complete by:  As directed      Diet general    Complete by:  As directed           Current Discharge Medication List    CONTINUE these medications which have NOT CHANGED   Details  ciprofloxacin (CIPRO) 750 MG tablet Take 750 mg by mouth  2 (two) times daily. Refills: 1    metroNIDAZOLE (FLAGYL) 500 MG tablet Take 500 mg by mouth 3 (three) times daily.  Refills: 1    oxyCODONE-acetaminophen (PERCOCET) 7.5-325 MG per tablet Take by mouth every 4 (four) hours as needed. For up to 5 days. Refills: 0    HYDROcodone-acetaminophen (NORCO/VICODIN) 5-325 MG per tablet Take 1-2 tablets by mouth every 4 (four) hours as needed. Qty: 10 tablet, Refills: 0    ondansetron (ZOFRAN ODT) 4 MG disintegrating tablet 4mg  ODT q4 hours prn nausea/vomit Qty: 10 tablet, Refills: 0       No Known Allergies    The results of significant diagnostics from this hospitalization (including imaging, microbiology, ancillary and laboratory) are listed below for reference.    Significant Diagnostic Studies: Dg Abd 1 View  10/25/2014   CLINICAL DATA:  RIGHT lower quadrant pain for 5 days, nausea  EXAM: ABDOMEN - 1 VIEW  COMPARISON:  CT abdomen and pelvis 10/23/2014  FINDINGS: Retained contrast in appendix, which appears normal in caliber.  Retained contrast throughout colon.  Dilated small bowel loops in the LEFT and mid abdomen compatible with small bowel obstruction, increased since previous exam.  Loops measure up to 4.8 cm diameter.  No definite bowel wall thickening.  Bones unremarkable.  No definite urinary tract calcification.  IMPRESSION: Increased small bowel dilatation in the LEFT and mid abdomen compatible with proximal to mid small bowel obstruction, affected small bowel loop diameters increased since previous exam.  Retained contrast within appendix  and colon.   Electronically Signed   By: Ulyses Southward M.D.   On: 10/25/2014 18:02   Ct Abdomen Pelvis W Contrast  10/20/2014   CLINICAL DATA:  39 year old male with acute right abdominal and pelvic pain and nausea with chills.  EXAM: CT ABDOMEN AND PELVIS WITH CONTRAST  TECHNIQUE: Multidetector CT imaging of the abdomen and pelvis was performed using the standard protocol following bolus administration  of intravenous contrast.  CONTRAST:  OMNIPAQUE IOHEXOL 300 MG/ML  SOLN  COMPARISON:  None.  FINDINGS: Lower chest:  Unremarkable  Hepatobiliary: The liver and gallbladder are unremarkable. There is no evidence of biliary dilatation.  Pancreas: Unremarkable  Spleen: Unremarkable  Adrenals/Urinary Tract: The kidneys, adrenal glands and bladder are unremarkable.  Stomach/Bowel: Unremarkable. There is no evidence of bowel obstruction or bowel wall thickening. The appendix is normal.  Vascular/Lymphatic: No enlarged lymph nodes or abdominal aortic aneurysm.  Reproductive: Prostate is unremarkable.  Other: No free fluid, abscess or pneumoperitoneum.  Musculoskeletal: No acute or suspicious abnormalities.  IMPRESSION: No acute or significant abnormalities.   Electronically Signed   By: Harmon Pier M.D.   On: 10/20/2014 15:04    Microbiology: No results found for this or any previous visit (from the past 240 hour(s)).   Labs: Basic Metabolic Panel:  Recent Labs Lab 10/20/14 1320 10/25/14 1535 10/25/14 2125 10/26/14 0633  NA 138 137  --  142  K 4.0 4.1  --  4.7  CL 104 102  --  106  CO2 29 26  --  28  GLUCOSE 108* 103*  --  106*  BUN 16 16  --  13  CREATININE 1.16 1.34 1.28 1.36*  CALCIUM 9.6 9.0  --  8.9   Liver Function Tests:  Recent Labs Lab 10/20/14 1320 10/25/14 1535  AST 30 25  ALT 39 25  ALKPHOS 85 82  BILITOT 0.5 0.9  PROT 8.2 7.5  ALBUMIN 4.7 3.7    Recent Labs Lab 10/20/14 1320  LIPASE 22   No results for input(s): AMMONIA in the last 168 hours. CBC:  Recent Labs Lab 10/20/14 1320 10/25/14 1535 10/25/14 2125 10/26/14 0633  WBC 14.6* 11.1* 11.4* 8.3  NEUTROABS  --  6.5  --   --   HGB 15.2 15.7 14.5 13.2  HCT 45.7 46.6 43.4 41.7  MCV 85.7 86.3 85.8 87.6  PLT 271 300 287 278   Cardiac Enzymes: No results for input(s): CKTOTAL, CKMB, CKMBINDEX, TROPONINI in the last 168 hours. BNP: BNP (last 3 results) No results for input(s): BNP in the last 8760  hours.  ProBNP (last 3 results) No results for input(s): PROBNP in the last 8760 hours.  CBG: No results for input(s): GLUCAP in the last 168 hours.     SignedChristiane Ha  [facility] 10/26/2014, 1:14 PM

## 2014-10-26 NOTE — Progress Notes (Signed)
NURSING PROGRESS NOTE  Marcus Ferguson [id number] Discharge Data: 10/26/2014 4:33 PM Attending Provider: No att. providers found PCP:No PCP Per Patient   Marcus Ferguson to be D/C'd Home per MD order.    All IV's will be discontinued and monitored for bleeding.  All belongings will be returned to patient for patient to take home.  Last Documented Vital Signs:  Blood pressure 135/81, pulse 78, temperature 98.5 F (36.9 C), temperature source Oral, resp. rate 17, SpO2 97 %.  Leane PlattSpencer Nishanth Mccaughan RN, BS, BSN

## 2014-10-26 NOTE — Progress Notes (Signed)
UR completed 

## 2014-10-26 NOTE — Care Management Note (Signed)
    Page 1 of 1   10/26/2014     3:26:26 PM CARE MANAGEMENT NOTE 10/26/2014  Patient:  Marcus Ferguson   Account Number:  [id number]  Date Initiated:  10/26/2014  Documentation initiated by:  Lawerance SabalSWIST,DEBBIE  Subjective/Objective Assessment:   RLG pain, leukocytosis, mild SBO lives home with family     Action/Plan:   will follow for any dc needs   Anticipated DC Date:  10/26/2014   Anticipated DC Plan:  HOME/SELF CARE  In-house referral  Clinical Social Worker      DC Planning Services  CM consult      Choice offered to / List presented to:             Status of service:  Completed, signed off Medicare Important Message given?   (If response is "NO", the following Medicare IM given date fields will be blank) Date Medicare IM given:   Medicare IM given by:   Date Additional Medicare IM given:   Additional Medicare IM given by:    Discharge Disposition:  HOME/SELF CARE  Per UR Regulation:  Reviewed for med. necessity/level of care/duration of stay  If discussed at Long Length of Stay Meetings, dates discussed:    Comments:  10-26-14 No needs at this time, pt discharged to home. Lawerance Sabalebbie Swist RN BSN CM

## 2014-10-27 LAB — GC/CHLAMYDIA PROBE AMP (~~LOC~~) NOT AT ARMC
CHLAMYDIA, DNA PROBE: NEGATIVE
Neisseria Gonorrhea: NEGATIVE

## 2016-01-16 IMAGING — CT CT ABD-PELV W/ CM
2 of 4 series · 17 of 46 positions shown, 19 images · IV contrast (APPLIED)
Comparison: None.

CLINICAL DATA: 38-year-old male with acute right abdominal and
pelvic pain and nausea with chills.

EXAM:
CT ABDOMEN AND PELVIS WITH CONTRAST
TECHNIQUE: Multidetector CT imaging of the abdomen and pelvis was performed
using the standard protocol following bolus administration of
intravenous contrast.
CONTRAST:  100mL OMNIPAQUE IOHEXOL 300 MG/ML  SOLN

[Series 2: abd/pelvis 5.0 b31f · axial · 0.82mm/px · z∈[+768,+1232]mm · 14 of 101 slices shown, 16 images]
[im 4/101  soft-tissue]
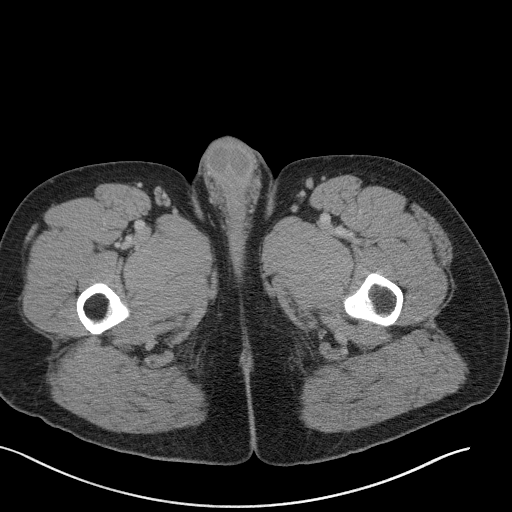
[im 4/101  bone]
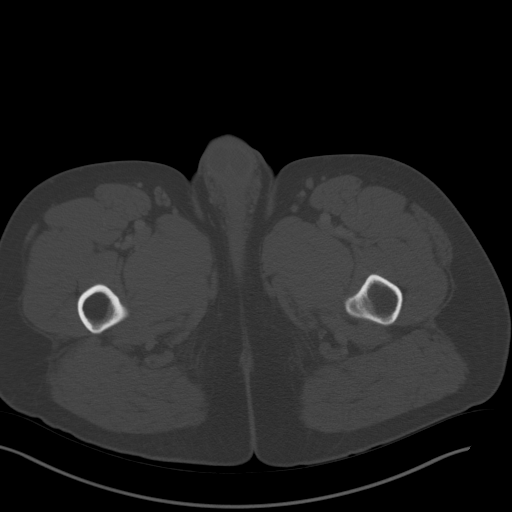
[im 12/101  soft-tissue]
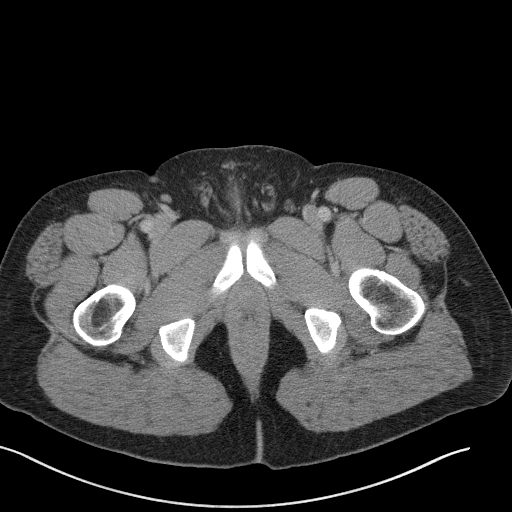
[im 20/101  soft-tissue]
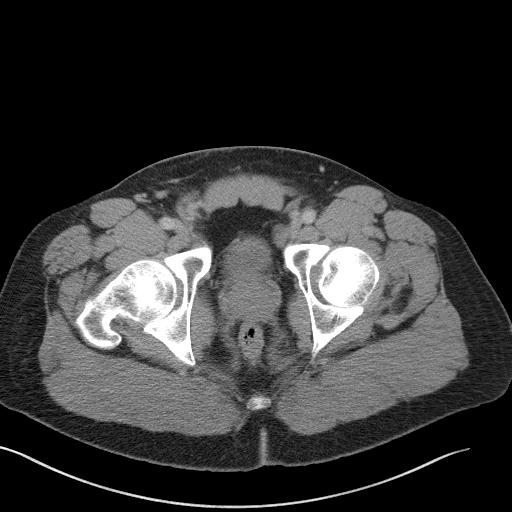
[im 27/101  soft-tissue]
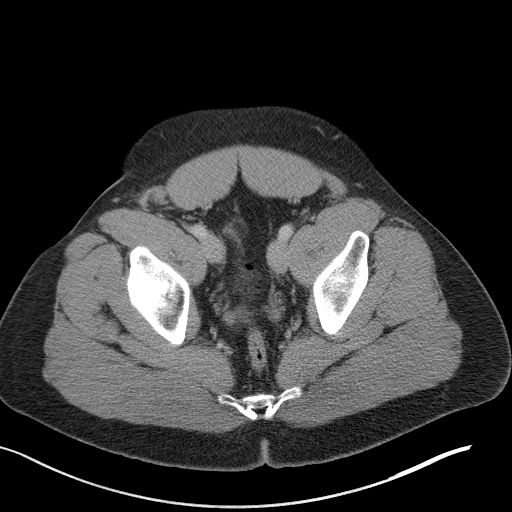
[im 35/101  soft-tissue]
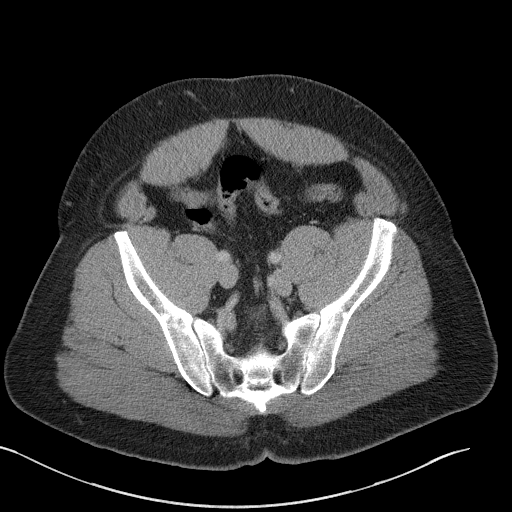
[im 39/101  soft-tissue]
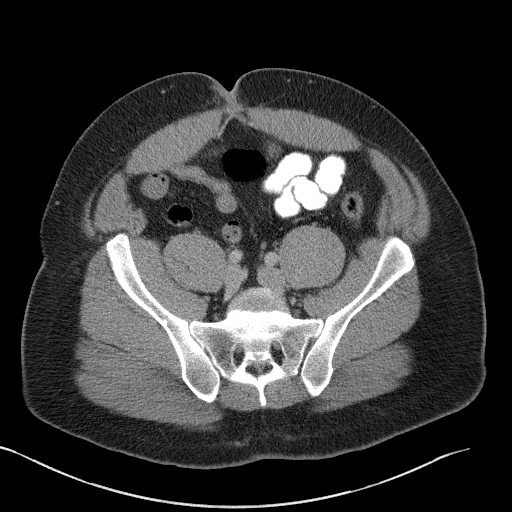
[im 47/101  soft-tissue]
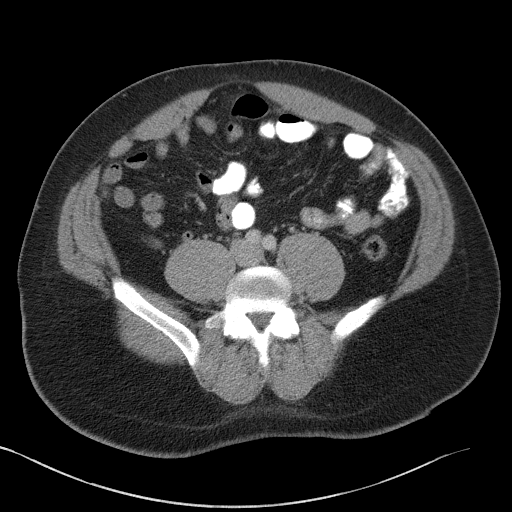
[im 54/101  soft-tissue]
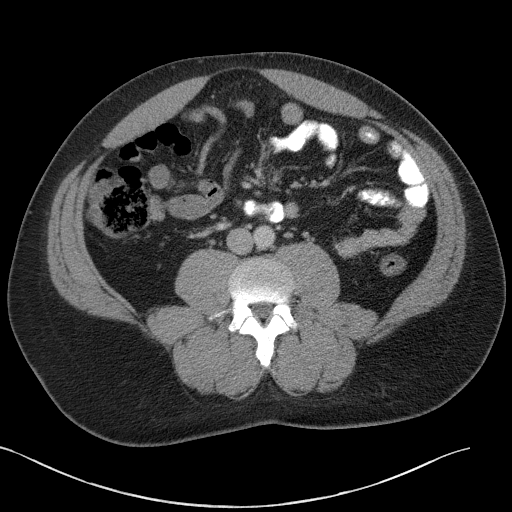
[im 62/101  soft-tissue]
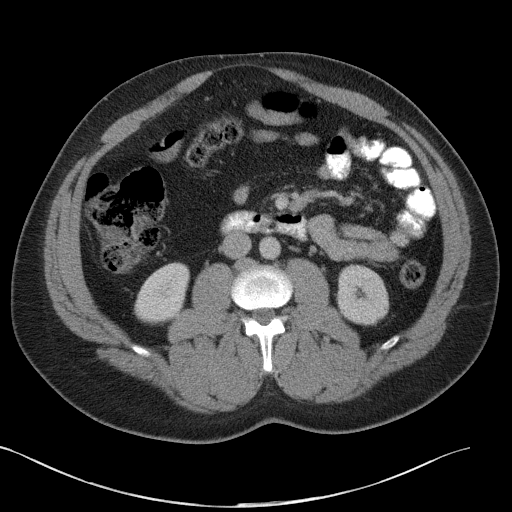
[im 62/101  bone]
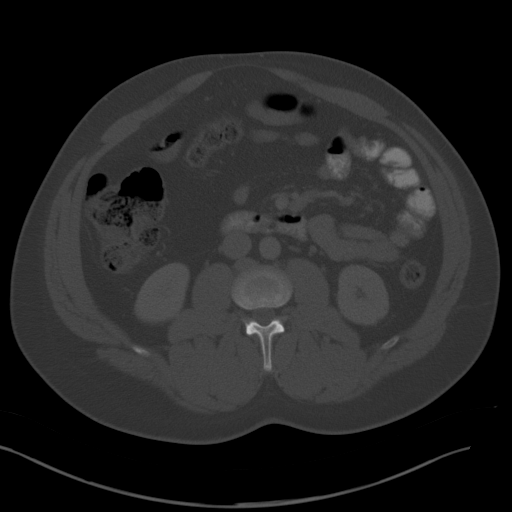
[im 66/101  soft-tissue]
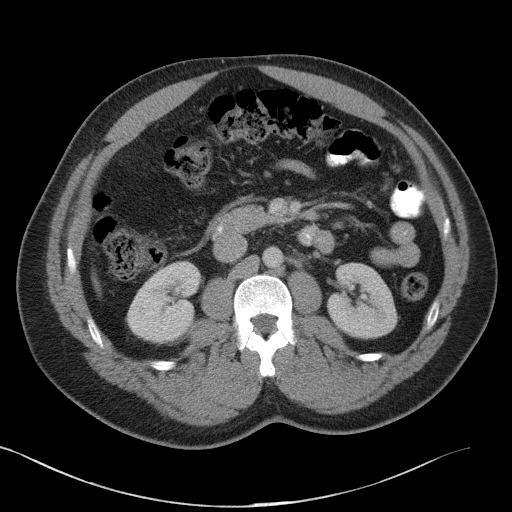
[im 74/101  soft-tissue]
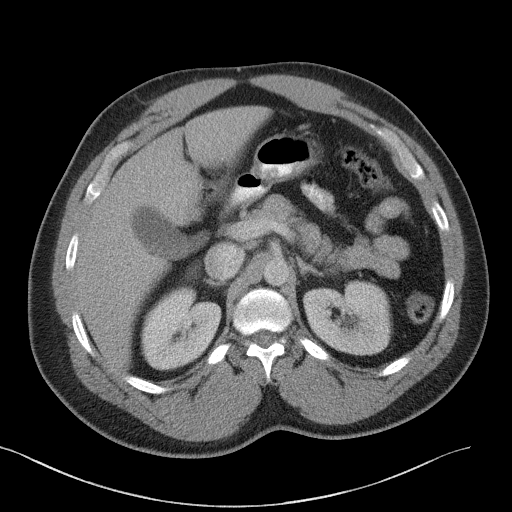
[im 81/101  soft-tissue]
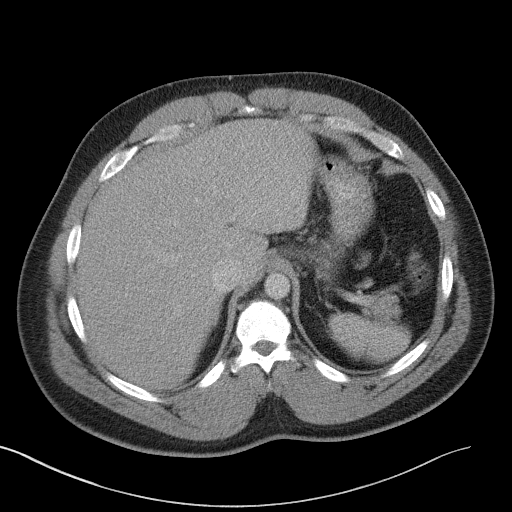
[im 89/101  soft-tissue]
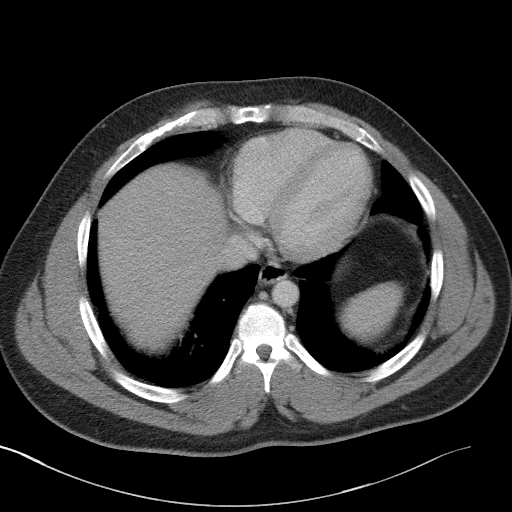
[im 97/101  soft-tissue]
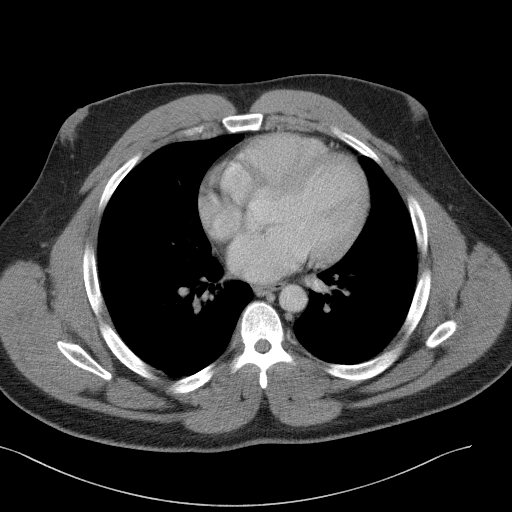

[Series 5: abd/pelvis 3.0 coronal · coronal · 1.01mm/px · 3 of 103 slices shown]
[im 35/103  soft-tissue]
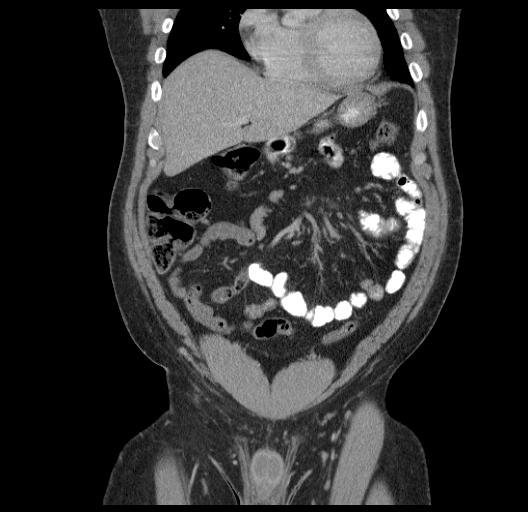
[im 46/103  soft-tissue]
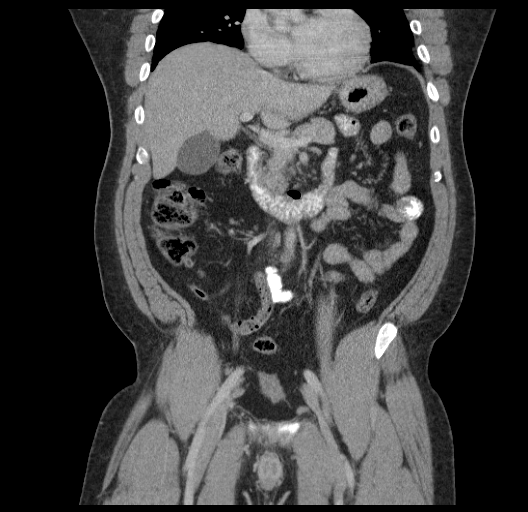
[im 57/103  soft-tissue]
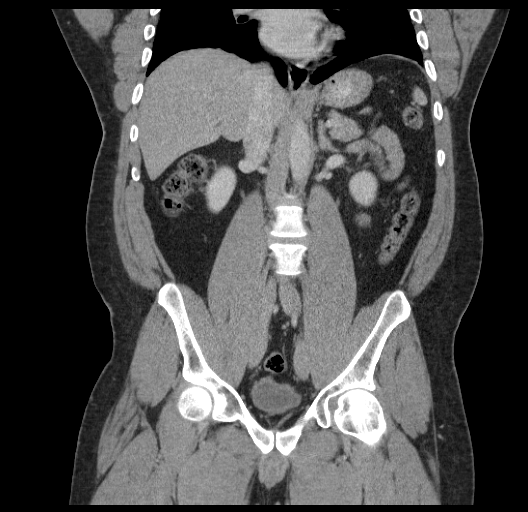

[17 of 46 positions shown; findings below may reference images not displayed]

FINDINGS: Lower chest:  Unremarkable

Hepatobiliary: The liver and gallbladder are unremarkable. There is
no evidence of biliary dilatation.

Pancreas: Unremarkable

Spleen: Unremarkable

Adrenals/Urinary Tract: The kidneys, adrenal glands and bladder are
unremarkable.

Stomach/Bowel: Unremarkable. There is no evidence of bowel
obstruction or bowel wall thickening. The appendix is normal.

Vascular/Lymphatic: No enlarged lymph nodes or abdominal aortic
aneurysm.

Reproductive: Prostate is unremarkable.

Other: No free fluid, abscess or pneumoperitoneum.

Musculoskeletal: No acute or suspicious abnormalities.
IMPRESSION: No acute or significant abnormalities.

## 2016-01-21 IMAGING — DX DG ABDOMEN 1V
2 series · 2 of 2 positions shown · non-contrast
Comparison: CT abdomen and pelvis 10/23/2014

CLINICAL DATA: RIGHT lower quadrant pain for 5 days, nausea

EXAM:
ABDOMEN - 1 VIEW

[abdomen supine (1 of 2)]
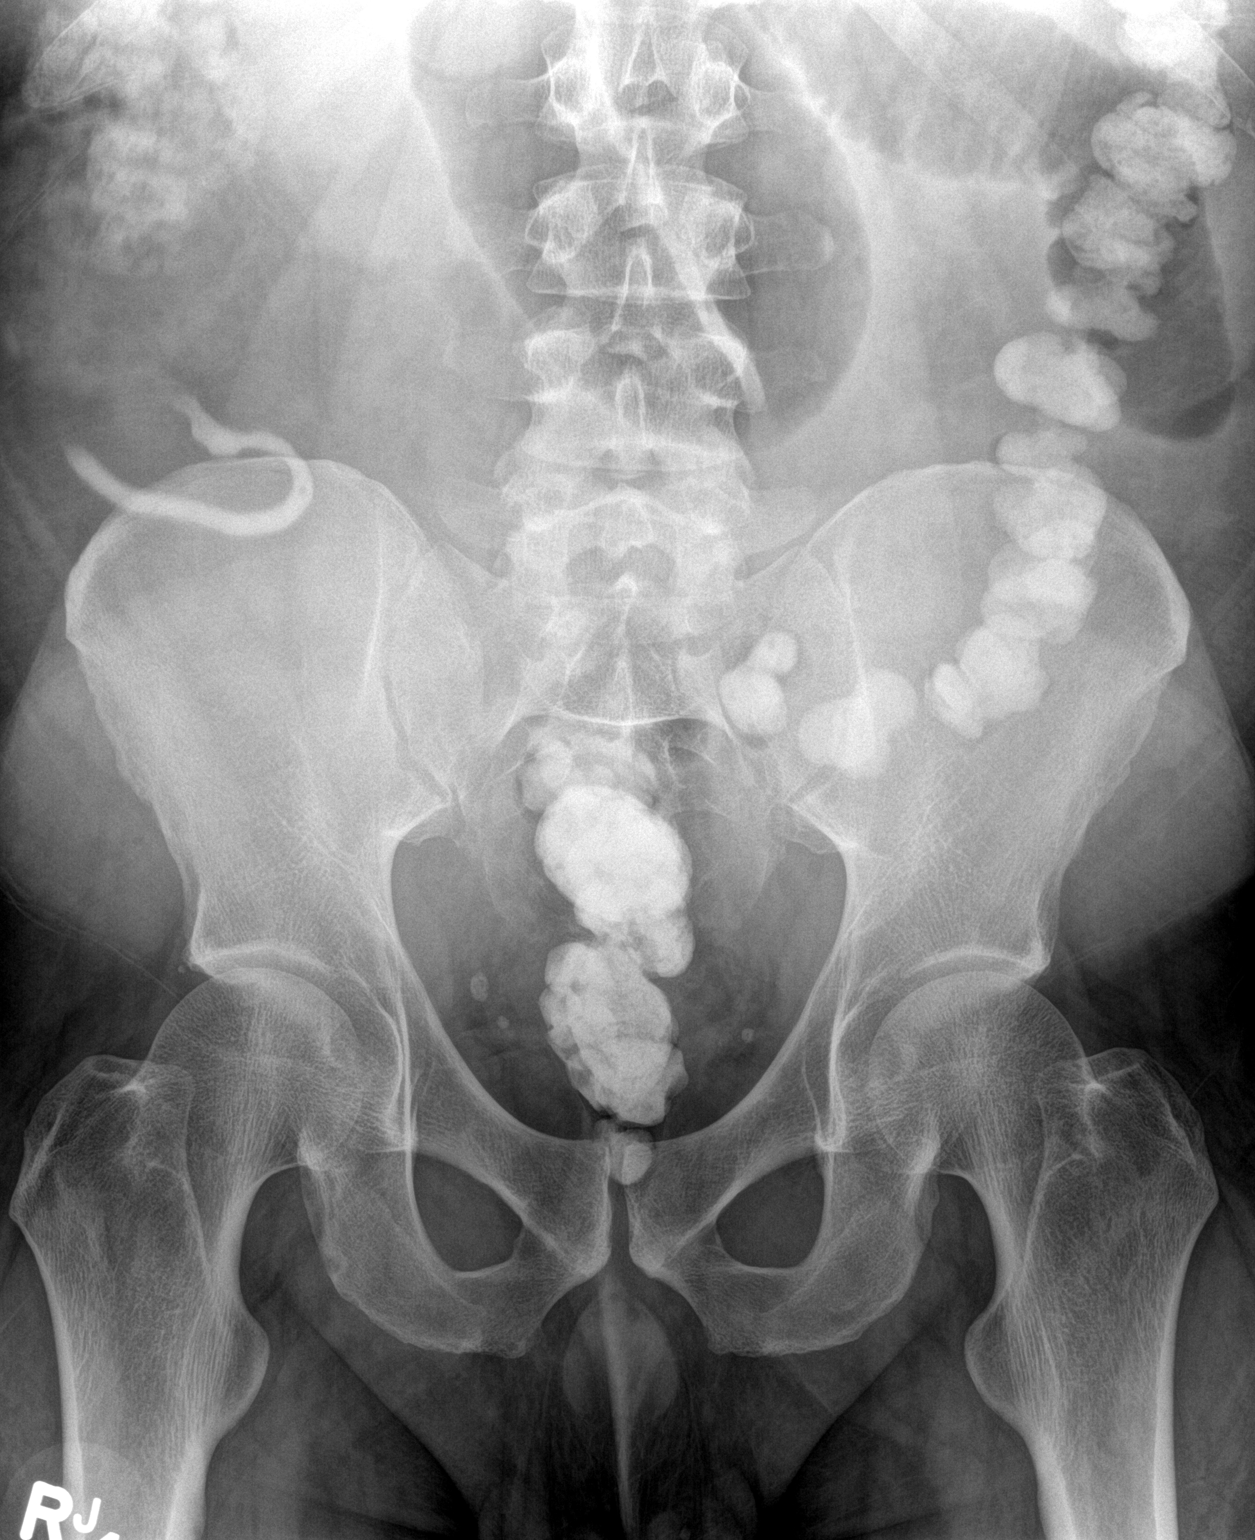

[abdomen supine (2 of 2)]
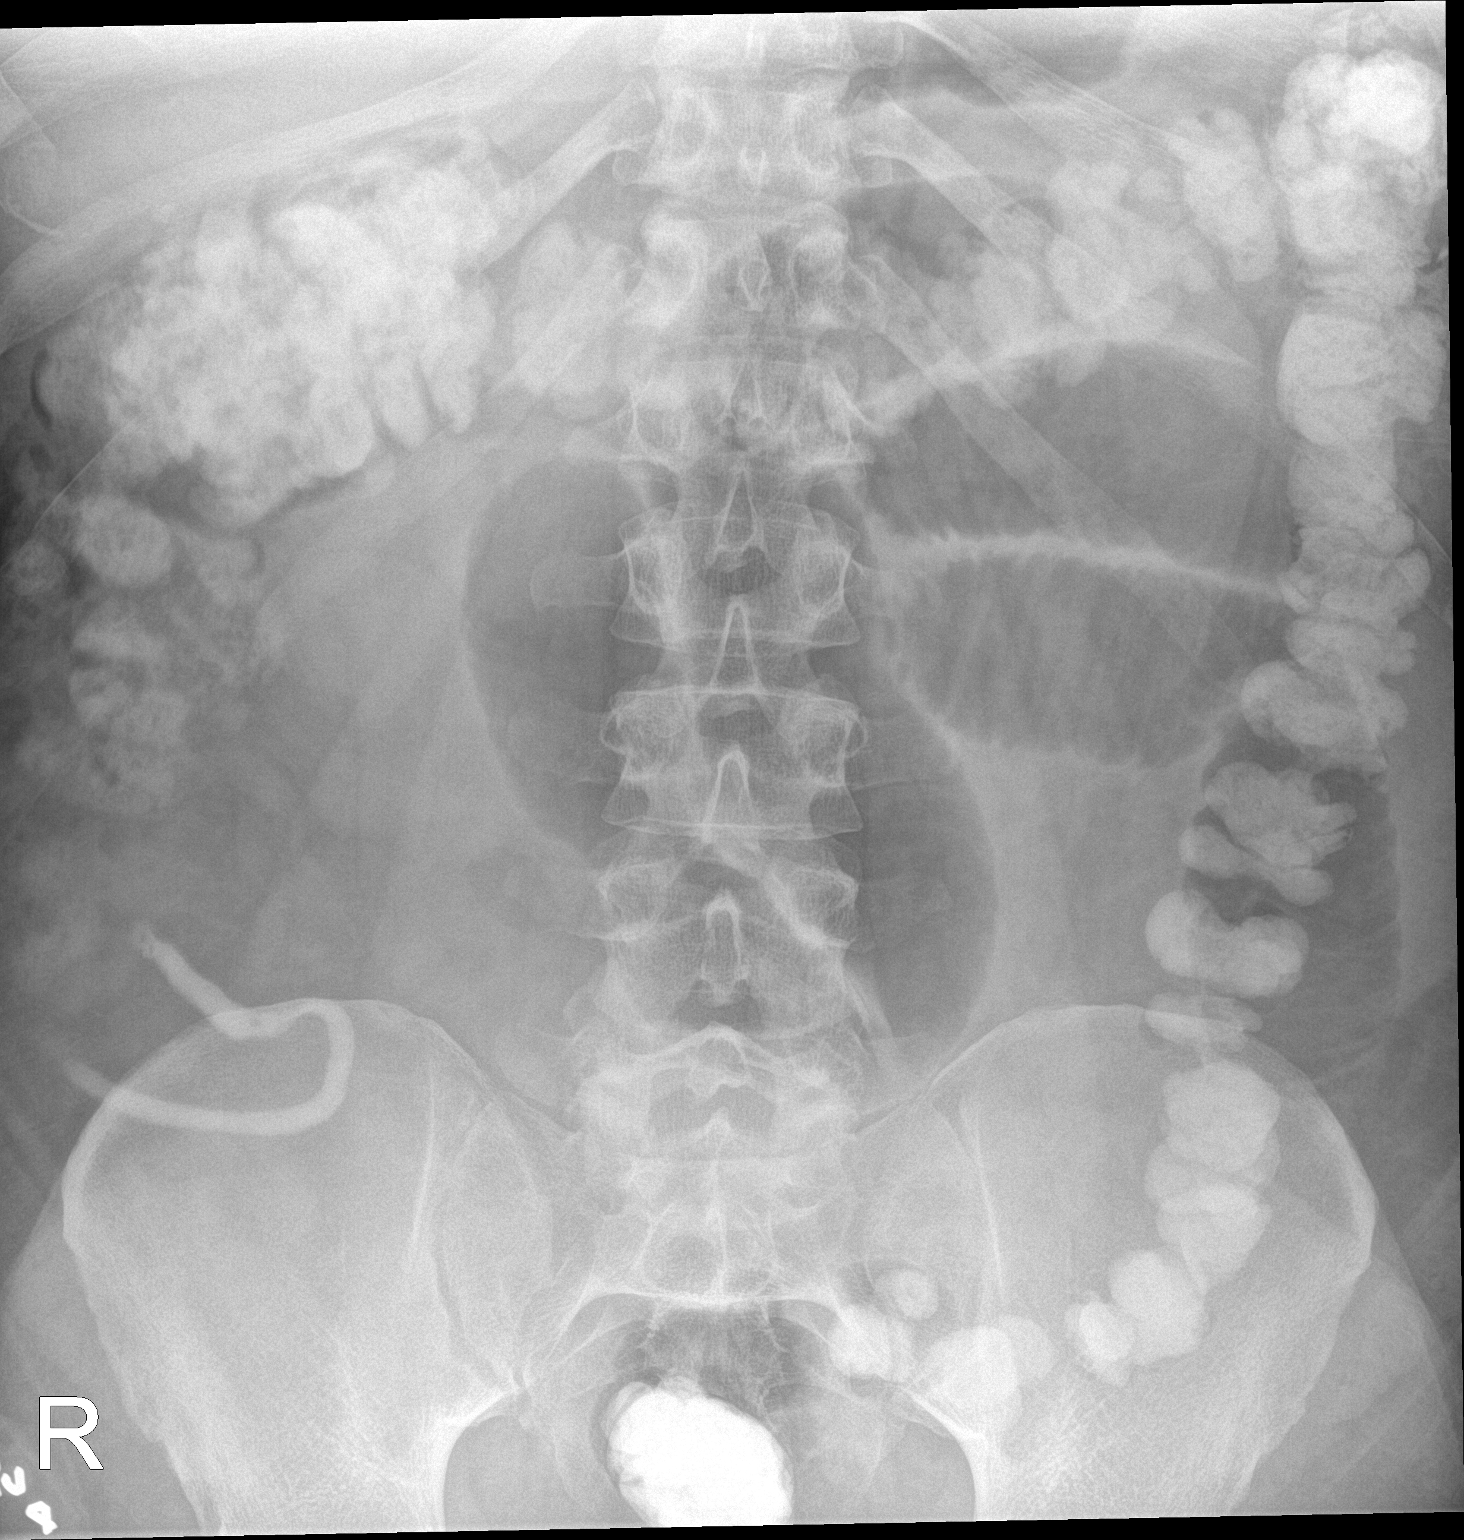

[2 of 2 positions shown; findings below may reference images not displayed]

FINDINGS: Retained contrast in appendix, which appears normal in caliber.

Retained contrast throughout colon.

Dilated small bowel loops in the LEFT and mid abdomen compatible
with small bowel obstruction, increased since previous exam.

Loops measure up to 4.8 cm diameter.

No definite bowel wall thickening.

Bones unremarkable.

No definite urinary tract calcification.
IMPRESSION: Increased small bowel dilatation in the LEFT and mid abdomen
compatible with proximal to mid small bowel obstruction, affected
small bowel loop diameters increased since previous exam.

Retained contrast within appendix and colon.
